# Patient Record
Sex: Female | Born: 1985 | Race: White | Hispanic: No | Smoking: Current every day smoker
Health system: Southern US, Community
[De-identification: ages and names within clinical notes are randomized; demographics above are authoritative.]

## PROBLEM LIST (undated history)

## (undated) DIAGNOSIS — F319 Bipolar disorder, unspecified: Secondary | ICD-10-CM

## (undated) HISTORY — PX: CHOLECYSTECTOMY: SHX55

## (undated) HISTORY — PX: TUBAL LIGATION: SHX77

## (undated) HISTORY — PX: TONSILLECTOMY: SUR1361

---

## 2019-09-15 ENCOUNTER — Encounter (HOSPITAL_COMMUNITY): Payer: Self-pay | Admitting: Emergency Medicine

## 2019-09-15 ENCOUNTER — Other Ambulatory Visit: Payer: Self-pay

## 2019-09-15 ENCOUNTER — Emergency Department (HOSPITAL_COMMUNITY)
Admission: EM | Admit: 2019-09-15 | Discharge: 2019-09-15 | Disposition: A | Discharge status: Home / Self Care | Payer: Self-pay | Attending: Emergency Medicine | Admitting: Emergency Medicine

## 2019-09-15 DIAGNOSIS — Y999 Unspecified external cause status: Secondary | ICD-10-CM | POA: Insufficient documentation

## 2019-09-15 DIAGNOSIS — X58XXXA Exposure to other specified factors, initial encounter: Secondary | ICD-10-CM | POA: Insufficient documentation

## 2019-09-15 DIAGNOSIS — K0889 Other specified disorders of teeth and supporting structures: Secondary | ICD-10-CM | POA: Insufficient documentation

## 2019-09-15 DIAGNOSIS — S025XXA Fracture of tooth (traumatic), initial encounter for closed fracture: Secondary | ICD-10-CM | POA: Insufficient documentation

## 2019-09-15 DIAGNOSIS — Y929 Unspecified place or not applicable: Secondary | ICD-10-CM | POA: Insufficient documentation

## 2019-09-15 DIAGNOSIS — Y9389 Activity, other specified: Secondary | ICD-10-CM | POA: Insufficient documentation

## 2019-09-15 DIAGNOSIS — K029 Dental caries, unspecified: Secondary | ICD-10-CM | POA: Insufficient documentation

## 2019-09-15 MED ORDER — AMOXICILLIN-POT CLAVULANATE 875-125 MG PO TABS: 1.0000 | ORAL_TABLET | Freq: Two times a day (BID) | ORAL | 0 refills | Status: DC

## 2019-09-15 MED ORDER — CHLORHEXIDINE GLUCONATE 0.12 % MT SOLN: 15.0000 mL | Freq: Two times a day (BID) | OROMUCOSAL | 0 refills | Status: DC

## 2019-09-15 NOTE — Progress Notes (Signed)
TOC CM received referral to assist with scheduling appt for follow up with dentist. TOC CM contacted pt and states she will schedule appt. Pt able to afford medication. Referring ED provider updated. Isidoro Donning RN CCM, [facility] ED TOC CM [phone]

## 2019-09-15 NOTE — ED Triage Notes (Signed)
Pt c/o right upper dental abscess for a couple of days.

## 2019-09-15 NOTE — Discharge Instructions (Addendum)
Please take Augmentin as prescribed.  Please follow-up with the dentist and I have provided you a phone number for.  Please take Benadryl if you have any itching or rash associated with your antibiotics.  Did not need drainage  Please also use the mouth rinse and I have provided twice daily.

## 2019-09-15 NOTE — ED Provider Notes (Signed)
[facility] DEPT Provider Note   CSN: [id number] Arrival date & time: 09/15/19  1119     History Chief Complaint  Patient presents with  . Dental Pain    Leah Shelton is a 34 y.o. female.   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  States that she has been clean for several years however she has had poor dentition since that time.  She has no dental insurance and states that she has not been able to follow-up with her dentist.  She is complaining of right upper dental pain that has been ongoing for 2 days.  She states that there has been no change in appearance of her teeth or gums however she states that it has been achy, constant, worse with touch with no associated fevers or chills or headaches.  She states she feels otherwise well.  She states that she has been able to swallow and she without difficulty.  She is concerned for dental infection like to be checked for that today.  She denies any nausea, vomiting, abdominal pain, facial swelling, neck pain, lockjaw, drooling, oral bleeding recent biotic use, history of cancer.    History reviewed. No pertinent past medical history.  There are no problems to display for this patient.   History reviewed. No pertinent surgical history.   OB History   No obstetric history on file.     No family history on file.  Social History   Tobacco Use  . Smoking status: Not on file  Substance Use Topics  . Alcohol use: Not on file  . Drug use: Not on file    Home  Medications Prior to Admission medications   Medication Sig Start Date End Date Taking? Authorizing Provider  amoxicillin-clavulanate (AUGMENTIN) 875-125 MG tablet Take 1 tablet by mouth every 12 (twelve) hours. 09/15/19   Tedd Sias, PA  chlorhexidine (PERIDEX) 0.12 % solution Use as directed 15 mLs in the mouth or throat 2 (two) times daily. 09/15/19   Tedd Sias, PA    Allergies    Keflex [cephalexin]  Review of Systems   Review of Systems  Constitutional: Negative for chills, diaphoresis, fatigue and fever.  HENT: Positive for dental problem. Negative for congestion, drooling and facial swelling.   Respiratory: Negative for shortness of breath.   Cardiovascular: Negative for chest pain.  Gastrointestinal: Negative for abdominal distention.  Neurological: Negative for dizziness and headaches.    Physical Exam Updated Vital Signs BP 123/81 (BP Location: Right Arm)   Pulse 87   Temp 97.7 F (36.5 C) (Oral)   Resp 16   LMP 09/11/2019   SpO2 100%   Physical Exam Vitals and nursing note reviewed.  Constitutional:      General: She is not in acute distress.    Appearance: Normal appearance. She is not ill-appearing.     Comments: Well-appearing 34 year old female in no significant distress, pleasant, appears stated age, able answer questions verbally and follow commands  HENT:     Head: Normocephalic and atraumatic.     Mouth/Throat:  Mouth: Mucous membranes are moist.     Dentition: Abnormal dentition. Dental caries present. No dental abscesses.      Comments: Poor dentition throughout.  No areas of swelling or fluctuance.  Tongue is fully mobile with no sublingual tenderness to palpation.  Posterior pharynx is patent with no tonsillar hypertrophy or exudate.  Multiple broken teeth throughout as well as numerous caries Eyes:     General: No scleral icterus.       Right eye: No discharge.        Left eye: No discharge.     Conjunctiva/sclera: Conjunctivae  normal.  Cardiovascular:     Rate and Rhythm: Normal rate.  Pulmonary:     Effort: Pulmonary effort is normal.     Breath sounds: No stridor.  Musculoskeletal:     Cervical back: Normal range of motion.  Lymphadenopathy:     Cervical: No cervical adenopathy.  Neurological:     Mental Status: She is alert and oriented to person, place, and time. Mental status is at baseline.  Psychiatric:        Mood and Affect: Mood normal.        Behavior: Behavior normal.     ED Results / Procedures / Treatments   Labs (all labs ordered are listed, but only abnormal results are displayed) Labs Reviewed - No data to display  EKG None  Radiology No results found.  Procedures Procedures (including critical care time)  Medications Ordered in ED Medications - No data to display  ED Course  I have reviewed the triage vital signs and the nursing notes.  Pertinent labs & imaging results that were available during my care of the patient were reviewed by me and considered in my medical decision making (see chart for details).    MDM Rules/Calculators/A&P                      Patient is well-appearing 34 year old female with no significant past medical history other than opioid abuse.  She has poor pain dentition for many years.  Has had dental pain for the past 2 days located in the patient's right upper molars.  No other significant symptoms today.  Physical exam is relatively unremarkable apart from poor dentition.  No evidence of abscess.  Doubt Ludewig's, trench mouth/mucormycosis, or deep space infection.  No lock jaw, trismus or meningismus.  Patient is well-appearing this time will discharge with antibiotics and recommend follow-up with dentist.  Patient given information on our on-call dentistry.     Final Clinical Impression(s) / ED Diagnoses Final diagnoses:  Pain, dental  Dental caries  Closed fracture of tooth, initial encounter    Rx / DC Orders ED Discharge Orders           Ordered    amoxicillin-clavulanate (AUGMENTIN) 875-125 MG tablet  Every 12 hours     09/15/19 1207    chlorhexidine (PERIDEX) 0.12 % solution  2 times daily     09/15/19 1207           Solon Augusta [place], [place] 09/15/19 1208    Arby Barrette, MD 09/19/19 1328

## 2020-01-06 ENCOUNTER — Ambulatory Visit (HOSPITAL_COMMUNITY)
Admission: EM | Admit: 2020-01-06 | Discharge: 2020-01-06 | Disposition: A | Discharge status: Home / Self Care | Payer: Self-pay | Attending: Family Medicine | Admitting: Family Medicine

## 2020-01-06 ENCOUNTER — Encounter (HOSPITAL_COMMUNITY): Payer: Self-pay

## 2020-01-06 ENCOUNTER — Other Ambulatory Visit: Payer: Self-pay

## 2020-01-06 DIAGNOSIS — N898 Other specified noninflammatory disorders of vagina: Secondary | ICD-10-CM | POA: Insufficient documentation

## 2020-01-06 DIAGNOSIS — R509 Fever, unspecified: Secondary | ICD-10-CM

## 2020-01-06 DIAGNOSIS — Z3202 Encounter for pregnancy test, result negative: Secondary | ICD-10-CM

## 2020-01-06 DIAGNOSIS — R1030 Lower abdominal pain, unspecified: Secondary | ICD-10-CM | POA: Insufficient documentation

## 2020-01-06 HISTORY — DX: Bipolar disorder, unspecified: F31.9

## 2020-01-06 LAB — POCT URINALYSIS DIP (DEVICE)
Bilirubin Urine: NEGATIVE
Glucose, UA: NEGATIVE mg/dL
Hgb urine dipstick: NEGATIVE
Ketones, ur: NEGATIVE mg/dL
Nitrite: NEGATIVE
Protein, ur: NEGATIVE mg/dL
Specific Gravity, Urine: <=1.005 (ref 1.005–1.030)
Urobilinogen, UA: 0.2 mg/dL (ref 0.0–1.0)
pH: 6.5 (ref 5.0–8.0)

## 2020-01-06 LAB — POC URINE PREG, ED: Preg Test, Ur: NEGATIVE

## 2020-01-06 MED ORDER — GENTAMICIN SULFATE 40 MG/ML IJ SOLN
INTRAMUSCULAR | Status: AC
  Filled 2020-01-06: qty 6

## 2020-01-06 MED ORDER — AZITHROMYCIN 250 MG PO TABS
ORAL_TABLET | ORAL | Status: AC
  Filled 2020-01-06: qty 4

## 2020-01-06 MED ORDER — AZITHROMYCIN 250 MG PO TABS
2000.0000 mg | ORAL_TABLET | Freq: Once | ORAL | Status: AC
  Administered 2020-01-06: 2000 mg via ORAL

## 2020-01-06 MED ORDER — GENTAMICIN SULFATE 40 MG/ML IJ SOLN
240.0000 mg | Freq: Once | INTRAMUSCULAR | Status: AC
  Administered 2020-01-06: 240 mg via INTRAMUSCULAR

## 2020-01-06 NOTE — ED Triage Notes (Signed)
Pt c/o nausea, HA, fever, 6/10 lower abdominal pain since yesterday. Pt denies V/D. Pt last BM was today. Pt states she got COVID rapid tested today and it was neg.

## 2020-01-06 NOTE — Discharge Instructions (Signed)
You have been given the following today for treatment of suspected gonorrhea and/or chlamydia:  Meds ordered this encounter  Medications   gentamicin (GARAMYCIN) injection 240 mg   azithromycin (ZITHROMAX) tablet 2,000 mg    Even though we have treated you today, we have sent testing for sexually transmitted infections. We will notify you of any positive results once they are received. If required, we will prescribe any medications you might need.  Please refrain from all sexual activity for at least the next seven days.

## 2020-01-06 NOTE — ED Provider Notes (Signed)
[facility]   [id number] 01/06/20 Arrival Time: 1343  ASSESSMENT & PLAN:  1. Vaginal discharge   2. Lower abdominal pain     Overall benign abdominal exam. No indications for urgent abdominal/pelvic imaging at this time. Discussed possibility of early PID. Urine with leukocytes. Urine culture and vaginal cytology pending. She agrees to empiric treatment. Cephalosporin allergy.    Discharge Instructions      You have been given the following today for treatment of suspected gonorrhea and/or chlamydia:  Meds ordered this encounter  Medications  . gentamicin (GARAMYCIN) injection 240 mg  . azithromycin (ZITHROMAX) tablet 2,000 mg    Even though we have treated you today, we have sent testing for sexually transmitted infections. We will notify you of any positive results once they are received. If required, we will prescribe any medications you might need.  Please refrain from all sexual activity for at least the next seven days.      ED with worsening symptoms. Reviewed expectations re: course of current medical issues. Questions answered. Outlined signs and symptoms indicating need for more acute intervention. Patient verbalized understanding. After Visit Summary given.   SUBJECTIVE: History from: patient. Leah Shelton is a 34 y.o. female who presents with complaint of intermittent lower abdominal discomfort. Onset gradual, first noted yesterday; 6/10 when present; poorly localized. Does not specifically wake her at night. Symptoms are unchanged since beginning. Fever: believed to be present, temp not taken. Aggravating factors: have not been identified. Alleviating factors: have not been identified. Associated symptoms: nausea without vomiting. She denies arthralgias and myalgias. Appetite: normal. PO intake: decreased. Ambulatory without assistance. Urinary symptoms: none. Bowel movements: have not significantly changed. History of similar: no.  OTC  treatment: none.  Questions exposure to STD a month or two ago. No care sought. Does reports some abnormal vaginal discharge recently.  Rapid COVID test today negative at outside facility.  No LMP recorded. Past Surgical History:  Procedure Laterality Date  . CHOLECYSTECTOMY    . TONSILLECTOMY    . TUBAL LIGATION       OBJECTIVE:  Vitals:   01/06/20 1408  BP: 106/66  Pulse: 88  Resp: 16  Temp: 98.7 F (37.1 C)  TempSrc: Oral  SpO2: 98%  Weight: 65.8 kg  Height: 5\' 2"  (1.575 m)    General appearance: alert, oriented, no acute distress HEENT: ; AT Lungs: unlabored respirations Abdomen: soft; without distention; no specific tenderness to palpation; without masses or organomegaly; without guarding or rebound tenderness GU: deferred Back: without reported CVA tenderness; FROM at waist Extremities: without LE edema; symmetrical; without gross deformities Skin: warm and dry Neurologic: normal gait Psychological: alert and cooperative; normal mood and affect  Labs: Results for orders placed or performed during the hospital encounter of 01/06/20  POC urine pregnancy  Result Value Ref Range   Preg Test, Ur NEGATIVE NEGATIVE  POCT urinalysis dip (device)  Result Value Ref Range   Glucose, UA NEGATIVE NEGATIVE mg/dL   Bilirubin Urine NEGATIVE NEGATIVE   Ketones, ur NEGATIVE NEGATIVE mg/dL   Specific Gravity, Urine <=1.005 1.005 - 1.030   Hgb urine dipstick NEGATIVE NEGATIVE   pH 6.5 5.0 - 8.0   Protein, ur NEGATIVE NEGATIVE mg/dL   Urobilinogen, UA 0.2 0.0 - 1.0 mg/dL   Nitrite NEGATIVE NEGATIVE   Leukocytes,Ua TRACE (A) NEGATIVE      Allergies  Allergen Reactions  . Keflex [Cephalexin] Hives  Past Medical History:  Diagnosis Date  . Bipolar 1 disorder ([place])     Social History   Socioeconomic History  . Marital status: Divorced    Spouse name: Not on file  . Number of children: Not on file  . Years of  education: Not on file  . Highest education level: Not on file  Occupational History  . Not on file  Tobacco Use  . Smoking status: Current Every Day Smoker    Packs/day: 0.50    Types: Cigarettes  . Smokeless tobacco: Never Used  Substance and Sexual Activity  . Alcohol use: Never  . Drug use: Never  . Sexual activity: Not on file  Other Topics Concern  . Not on file  Social History Narrative  . Not on file   Social Determinants of Health   Financial Resource Strain:   . Difficulty of Paying Living Expenses:   Food Insecurity:   . Worried About Charity fundraiser in the Last Year:   . Arboriculturist in the Last Year:   Transportation Needs:   . Film/video editor (Medical):   Marland Kitchen Lack of Transportation (Non-Medical):   Physical Activity:   . Days of Exercise per Week:   . Minutes of Exercise per Session:   Stress:   . Feeling of Stress :   Social Connections:   . Frequency of Communication with Friends and Family:   . Frequency of Social Gatherings with Friends and Family:   . Attends Religious Services:   . Active Member of Clubs or Organizations:   . Attends Archivist Meetings:   Marland Kitchen Marital Status:   Intimate Partner Violence:   . Fear of Current or Ex-Partner:   . Emotionally Abused:   Marland Kitchen Physically Abused:   . Sexually Abused:     Family History  Problem Relation Age of Onset  . Healthy Mother   . Healthy Father      Vanessa Kick, MD 01/06/20 [phone]

## 2020-01-06 NOTE — ED Notes (Signed)
Cervicovaginal swab placed in lab, labeling and verified

## 2020-01-07 ENCOUNTER — Telehealth (HOSPITAL_COMMUNITY): Payer: Self-pay | Admitting: Orthopedic Surgery

## 2020-01-07 LAB — CERVICOVAGINAL ANCILLARY ONLY
Bacterial Vaginitis (gardnerella): POSITIVE — AB
Candida Glabrata: NEGATIVE
Candida Vaginitis: NEGATIVE
Chlamydia: POSITIVE — AB
Comment: NEGATIVE
Comment: NEGATIVE
Comment: NEGATIVE
Comment: NEGATIVE
Comment: NEGATIVE
Comment: NORMAL
Neisseria Gonorrhea: NEGATIVE
Trichomonas: NEGATIVE

## 2020-01-07 MED ORDER — METRONIDAZOLE 500 MG PO TABS: 500.0000 mg | ORAL_TABLET | Freq: Two times a day (BID) | ORAL | 0 refills | Status: DC

## 2020-01-08 ENCOUNTER — Telehealth (HOSPITAL_COMMUNITY): Payer: Self-pay | Admitting: Orthopedic Surgery

## 2020-01-08 LAB — URINE CULTURE: Culture: 10000 — AB

## 2020-01-08 MED ORDER — NITROFURANTOIN MONOHYD MACRO 100 MG PO CAPS: 100.0000 mg | ORAL_CAPSULE | Freq: Two times a day (BID) | ORAL | 0 refills | Status: DC

## 2020-02-24 ENCOUNTER — Other Ambulatory Visit: Payer: Self-pay

## 2020-02-24 ENCOUNTER — Telehealth (INDEPENDENT_AMBULATORY_CARE_PROVIDER_SITE_OTHER): Payer: No Payment, Other | Admitting: Psychiatric/Mental Health

## 2020-02-24 DIAGNOSIS — F339 Major depressive disorder, recurrent, unspecified: Secondary | ICD-10-CM | POA: Diagnosis not present

## 2020-02-24 DIAGNOSIS — F411 Generalized anxiety disorder: Secondary | ICD-10-CM

## 2020-02-24 DIAGNOSIS — F3162 Bipolar disorder, current episode mixed, moderate: Secondary | ICD-10-CM | POA: Diagnosis not present

## 2020-02-24 DIAGNOSIS — F329 Major depressive disorder, single episode, unspecified: Secondary | ICD-10-CM | POA: Insufficient documentation

## 2020-02-24 MED ORDER — GABAPENTIN 300 MG PO CAPS: 300.0000 mg | ORAL_CAPSULE | Freq: Three times a day (TID) | ORAL | 2 refills | Status: DC

## 2020-02-24 MED ORDER — LITHIUM CARBONATE ER 300 MG PO TBCR: 300.0000 mg | EXTENDED_RELEASE_TABLET | Freq: Two times a day (BID) | ORAL | 3 refills | Status: DC

## 2020-03-15 ENCOUNTER — Encounter (HOSPITAL_COMMUNITY): Payer: Self-pay | Admitting: Psychiatric/Mental Health
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
## 2020-04-05 ENCOUNTER — Ambulatory Visit (INDEPENDENT_AMBULATORY_CARE_PROVIDER_SITE_OTHER): Payer: No Payment, Other | Admitting: Clinical

## 2020-04-05 ENCOUNTER — Other Ambulatory Visit: Payer: Self-pay

## 2020-04-05 DIAGNOSIS — F1111 Opioid abuse, in remission: Secondary | ICD-10-CM

## 2020-04-05 DIAGNOSIS — F3162 Bipolar disorder, current episode mixed, moderate: Secondary | ICD-10-CM
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
## 2020-05-26 ENCOUNTER — Other Ambulatory Visit: Payer: Self-pay

## 2020-05-26 ENCOUNTER — Telehealth (HOSPITAL_COMMUNITY): Payer: No Payment, Other

## 2020-05-26 ENCOUNTER — Telehealth (HOSPITAL_COMMUNITY): Payer: No Payment, Other | Admitting: Physician Assistant

## 2020-06-02 ENCOUNTER — Other Ambulatory Visit: Payer: Self-pay

## 2020-06-02 ENCOUNTER — Ambulatory Visit (HOSPITAL_COMMUNITY): Payer: No Payment, Other | Admitting: Clinical

## 2020-06-16 ENCOUNTER — Telehealth (HOSPITAL_COMMUNITY): Payer: Self-pay | Admitting: Clinical

## 2020-06-16 ENCOUNTER — Other Ambulatory Visit: Payer: Self-pay

## 2020-06-16 ENCOUNTER — Ambulatory Visit (HOSPITAL_COMMUNITY): Payer: No Payment, Other | Admitting: Clinical

## 2020-06-16 NOTE — Telephone Encounter (Signed)
Therapist sent the client a text message link for the scheduled therapy MyChart video visit. Client did not check in for the appointment using the link. Therapist followed up with an attempted phone call to the clients cell phone indicated as preferred method of contact, to attempt second method of contact with the client. Client did not answer. Therapist left a voice mail for the client to call back to reschedule the appointment.

## 2020-06-29 ENCOUNTER — Telehealth (HOSPITAL_COMMUNITY): Payer: Self-pay | Admitting: *Deleted

## 2020-06-29 NOTE — Telephone Encounter (Signed)
VM from patient stating the next appt we have here is early Feb, yet on the chart there is not an appt scheduled. She missed her last scheduled appt in mid Nov. She is out of her medicine, has not been seen since 8/3 but did have enough med and refills to get till Nov. Front desk staff called her to reschedule. She was vague, and after much back and forth only day she could be seen this week is fri and we have nothing available on fri. Offered her a appt today @ 3 but couldn't come, states she is working and refused a virtual today too. When gave her the first fri appt it was [place] and she said she couldn't wait that long and now says her schedule changes weekly and not sure if appt in jan would be her fri off. Agreed she would call back when she has her next weeks schedule.

## 2020-11-01 ENCOUNTER — Other Ambulatory Visit: Payer: Self-pay

## 2020-11-01 ENCOUNTER — Encounter (HOSPITAL_COMMUNITY): Payer: Self-pay

## 2020-11-01 ENCOUNTER — Emergency Department (HOSPITAL_COMMUNITY)
Admission: EM | Admit: 2020-11-01 | Discharge: 2020-11-01 | Disposition: A | Discharge status: Home / Self Care | Payer: Self-pay | Attending: Emergency Medicine | Admitting: Emergency Medicine

## 2020-11-01 DIAGNOSIS — K047 Periapical abscess without sinus: Secondary | ICD-10-CM | POA: Insufficient documentation

## 2020-11-01 DIAGNOSIS — F17210 Nicotine dependence, cigarettes, uncomplicated: Secondary | ICD-10-CM | POA: Insufficient documentation

## 2020-11-01 DIAGNOSIS — K029 Dental caries, unspecified: Secondary | ICD-10-CM | POA: Insufficient documentation

## 2020-11-01 MED ORDER — AMOXICILLIN-POT CLAVULANATE 875-125 MG PO TABS: 1.0000 | ORAL_TABLET | Freq: Two times a day (BID) | ORAL | 0 refills | Status: AC

## 2020-11-01 NOTE — ED Triage Notes (Signed)
Pt reports dental pain to right of mouth and states the pain radiates up her face.

## 2020-11-01 NOTE — ED Provider Notes (Addendum)
[facility]-EMERGENCY DEPT Provider Note   CSN: [id number] Arrival date & time: 11/01/20  1822     History Chief Complaint  Patient presents with  . Dental Pain    Leah Shelton is a 35 y.o. female with PMH of bipolar 1, MDD, GAD that presents to the ER for dental pain. Pt states that she has had multiple dental caries for the last couple of years from opiate use. Not seen a dentist. Complaining of toothache in the R upper part of her mouth, started a couple days ago. Has tried tylenol without relief. Has tried calling a dentist, apt far out. Toothpain worse today. Doesn't radiate. Denies any fevers, trismus, drooling, difficulty swallowing or breathing. No sore throat, ear pain, HA. Has not seen a dentist. No other complaints.    HPI     Past Medical History:  Diagnosis Date  . Bipolar 1 disorder [facility])     Patient Active Problem List   Diagnosis Date Noted  . Bipolar 1 disorder, mixed, moderate (HCC) 02/24/2020  . MDD (major depressive disorder) 02/24/2020  . GAD (generalized anxiety disorder) 02/24/2020    Past Surgical History:  Procedure Laterality Date  . CHOLECYSTECTOMY    . TONSILLECTOMY    . TUBAL LIGATION       OB History   No obstetric history on file.     Family History  Problem Relation Age of Onset  . Healthy Mother   . Healthy Father     Social History   Tobacco Use  . Smoking status: Current Every Day Smoker    Packs/day: 0.50    Types: Cigarettes  . Smokeless tobacco: Never Used  Substance Use Topics  . Alcohol use: Never  . Drug use: Never    Home Medications Prior to Admission medications   Medication Sig Start Date End Date Taking? Authorizing Provider  amoxicillin-clavulanate (AUGMENTIN) 875-125 MG tablet Take 1 tablet by mouth every 12 (twelve) hours for 7 days. 11/01/20 11/08/20 Yes Emauri Krygier, PA-C  gabapentin (NEURONTIN) 300 MG capsule Take 1 capsule (300 mg total) by mouth 3 (three) times daily.  02/24/20 02/23/21  Jearld Lesch, NP  lithium carbonate (LITHOBID) 300 MG CR tablet Take 1 tablet (300 mg total) by mouth in the morning and at bedtime. 02/24/20 06/23/20  Jearld Lesch, NP    Allergies    Keflex [cephalexin]  Review of Systems   Review of Systems  Constitutional: Negative for diaphoresis, fatigue and fever.  HENT: Positive for dental problem. Negative for drooling, ear discharge, ear pain, facial swelling, hearing loss, sinus pressure, sinus pain, sore throat, tinnitus and trouble swallowing.   Eyes: Negative for visual disturbance.  Respiratory: Negative for shortness of breath.   Cardiovascular: Negative for chest pain.  Gastrointestinal: Negative for nausea and vomiting.  Musculoskeletal: Negative for back pain and myalgias.  Skin: Negative for color change, pallor, rash and wound.  Neurological: Negative for syncope, weakness, light-headedness, numbness and headaches.  Psychiatric/Behavioral: Negative for behavioral problems and confusion.    Physical Exam Updated Vital Signs BP (!) 141/86 (BP Location: Right Arm)   Pulse 72   Temp 98.6 F (37 C) (Oral)   Resp 18   SpO2 99%   Physical Exam Constitutional:      General: She is not in acute distress.    Appearance: Normal appearance. She is not ill-appearing, toxic-appearing or diaphoretic.  HENT:     Head: Normocephalic and atraumatic.      Comments: Facial  pain in area depicted. No swelling or erythema. No trismus.     Mouth/Throat:     Mouth: Mucous membranes are moist.     Dentition: Abnormal dentition. Dental tenderness and dental caries present. No dental abscesses.     Tongue: No lesions.     Palate: No mass.     Pharynx: Oropharynx is clear. Uvula midline. No pharyngeal swelling.     Tonsils: No tonsillar exudate or tonsillar abscesses. 0 on the right. 0 on the left.      Comments: Multiple dental caries, has about 4 teeth in upper row and 6 teeth in lower row, all broken off. R upper tooth,  tooth 5/6 with tenderness on gum line. Erythema noted, no palpable abscess. No swelling under tongue.  Eyes:     Extraocular Movements: Extraocular movements intact.     Pupils: Pupils are equal, round, and reactive to light.  Cardiovascular:     Rate and Rhythm: Normal rate and regular rhythm.     Pulses: Normal pulses.  Pulmonary:     Effort: Pulmonary effort is normal.     Breath sounds: Normal breath sounds.  Musculoskeletal:        General: Normal range of motion.  Skin:    General: Skin is warm and dry.     Capillary Refill: Capillary refill takes less than 2 seconds.  Neurological:     General: No focal deficit present.     Mental Status: She is alert and oriented to person, place, and time.  Psychiatric:        Mood and Affect: Mood normal.        Behavior: Behavior normal.        Thought Content: Thought content normal.     ED Results / Procedures / Treatments   Labs (all labs ordered are listed, but only abnormal results are displayed) Labs Reviewed - No data to display  EKG None  Radiology No results found.  Procedures Procedures   Medications Ordered in ED Medications - No data to display  ED Course  I have reviewed the triage vital signs and the nursing notes.  Pertinent labs & imaging results that were available during my care of the patient were reviewed by me and considered in my medical decision making (see chart for details).    MDM Rules/Calculators/A&P                          Leah Shelton is a 35 y.o. female with PMH of bipolar 1, MDD, GAD that presents to the ER for dental pain. Pt with  No red flag symptoms. No fever, signs of Ludwigs or deep space infection.  Dental pain associated with dental infection and possible dental abscess with patient afebrile, non toxic appearing and swallowing secretions well. I gave patient referral to dentist and stressed the importance of dental follow up for ultimate management of dental pain. I have  also discussed reasons to return immediately to the ER.  Patient expresses understanding and agrees with plan.  Pt does express remote allergy to Keflex with reaction of hives, no airway involvement. Wants to proceed with Augmentin, states she has taken Amoxil before. Did offer clinda, but she wants to do Augmentin. Strict return precaution give in regards to allergy.  Doubt need for further emergent work up at this time. I explained the diagnosis and have given explicit precautions to return to the ER including for any other new or  worsening symptoms. The patient understands and accepts the medical plan as it's been dictated and I have answered their questions. Discharge instructions concerning home care and prescriptions have been given. The patient is STABLE and is discharged to home in good condition.  Final Clinical Impression(s) / ED Diagnoses Final diagnoses:  Dental infection    Rx / DC Orders ED Discharge Orders         Ordered    amoxicillin-clavulanate (AUGMENTIN) 875-125 MG tablet  Every 12 hours        11/01/20 1848             Farrel Gordon, PA-C 11/01/20 Herbie Baltimore    Tilden Fossa, MD 11/02/20 1453

## 2020-11-01 NOTE — Discharge Instructions (Signed)
Please see a dentist ASAP, resources attached for dentist in the area.  Take antibiotic as prescribed, if you start having allergic reaction call 911 and take benadryl.  Take Tylenol as directed on the bottle for pain.  Please speak to a pharmacist about any new medications prescribed today in regards to side effects. Come back to the ER for any new or worsening symptoms.   Get help right away if: You have a fever or chills. Your symptoms suddenly get worse. You have a very bad headache. You have problems breathing or swallowing. You have trouble opening your mouth. You have swelling in your neck or around your eye.

## 2020-11-17 ENCOUNTER — Other Ambulatory Visit: Payer: Self-pay

## 2020-11-17 ENCOUNTER — Telehealth (INDEPENDENT_AMBULATORY_CARE_PROVIDER_SITE_OTHER): Payer: No Payment, Other | Admitting: Psychiatry

## 2020-11-17 ENCOUNTER — Encounter (HOSPITAL_COMMUNITY): Payer: Self-pay | Admitting: Psychiatry

## 2020-11-17 DIAGNOSIS — F411 Generalized anxiety disorder: Secondary | ICD-10-CM

## 2020-11-17 DIAGNOSIS — F4310 Post-traumatic stress disorder, unspecified: Secondary | ICD-10-CM | POA: Diagnosis not present

## 2020-11-17 DIAGNOSIS — F3181 Bipolar II disorder: Secondary | ICD-10-CM

## 2020-11-17 MED ORDER — TRAZODONE HCL 50 MG PO TABS
50.0000 mg | ORAL_TABLET | Freq: Every day | ORAL | 2 refills | Status: DC
  Filled 2020-11-17: qty 30, 30d supply, fill #0

## 2020-11-17 MED ORDER — GABAPENTIN 300 MG PO CAPS
300.0000 mg | ORAL_CAPSULE | Freq: Three times a day (TID) | ORAL | 2 refills | Status: DC
  Filled 2020-11-17: qty 90, 30d supply, fill #0

## 2020-11-17 MED ORDER — CAPLYTA 42 MG PO CAPS
42.0000 mg | ORAL_CAPSULE | Freq: Every day | ORAL | 2 refills | Status: DC
  Filled 2020-11-17: qty 30, fill #0
  Filled 2020-11-17: qty 30, 30d supply, fill #0
  Filled 2020-12-22: qty 30, 30d supply, fill #1
  Filled 2021-01-25: qty 30, 30d supply, fill #2
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
## 2020-11-18 ENCOUNTER — Other Ambulatory Visit: Payer: Self-pay

## 2020-11-19 ENCOUNTER — Other Ambulatory Visit: Payer: Self-pay

## 2020-11-22 ENCOUNTER — Other Ambulatory Visit: Payer: Self-pay

## 2020-12-03 ENCOUNTER — Other Ambulatory Visit: Payer: Self-pay

## 2020-12-15 ENCOUNTER — Emergency Department (HOSPITAL_COMMUNITY): Payer: Self-pay

## 2020-12-15 ENCOUNTER — Other Ambulatory Visit: Payer: Self-pay

## 2020-12-15 ENCOUNTER — Emergency Department (HOSPITAL_COMMUNITY)
Admission: EM | Admit: 2020-12-15 | Discharge: 2020-12-15 | Disposition: A | Discharge status: Home / Self Care | Payer: Self-pay | Attending: Emergency Medicine | Admitting: Emergency Medicine

## 2020-12-15 ENCOUNTER — Encounter (HOSPITAL_COMMUNITY): Payer: Self-pay

## 2020-12-15 DIAGNOSIS — R112 Nausea with vomiting, unspecified: Secondary | ICD-10-CM | POA: Insufficient documentation

## 2020-12-15 DIAGNOSIS — R0602 Shortness of breath: Secondary | ICD-10-CM | POA: Insufficient documentation

## 2020-12-15 DIAGNOSIS — Z20822 Contact with and (suspected) exposure to covid-19: Secondary | ICD-10-CM | POA: Insufficient documentation

## 2020-12-15 DIAGNOSIS — R197 Diarrhea, unspecified: Secondary | ICD-10-CM | POA: Insufficient documentation

## 2020-12-15 DIAGNOSIS — R062 Wheezing: Secondary | ICD-10-CM | POA: Insufficient documentation

## 2020-12-15 DIAGNOSIS — F17210 Nicotine dependence, cigarettes, uncomplicated: Secondary | ICD-10-CM | POA: Insufficient documentation

## 2020-12-15 DIAGNOSIS — R059 Cough, unspecified: Secondary | ICD-10-CM | POA: Insufficient documentation

## 2020-12-15 DIAGNOSIS — R509 Fever, unspecified: Secondary | ICD-10-CM | POA: Insufficient documentation

## 2020-12-15 LAB — RESP PANEL BY RT-PCR (FLU A&B, COVID) ARPGX2
Influenza A by PCR: POSITIVE — AB
Influenza B by PCR: NEGATIVE
SARS Coronavirus 2 by RT PCR: NEGATIVE

## 2020-12-15 MED ORDER — ALBUTEROL SULFATE (2.5 MG/3ML) 0.083% IN NEBU
5.0000 mg | INHALATION_SOLUTION | Freq: Once | RESPIRATORY_TRACT | Status: AC
  Administered 2020-12-15: 5 mg via RESPIRATORY_TRACT
  Filled 2020-12-15: qty 6

## 2020-12-15 MED ORDER — ALBUTEROL SULFATE HFA 108 (90 BASE) MCG/ACT IN AERS
2.0000 | INHALATION_SPRAY | Freq: Once | RESPIRATORY_TRACT | Status: AC
  Administered 2020-12-15: 2 via RESPIRATORY_TRACT
  Filled 2020-12-15: qty 6.7

## 2020-12-15 MED ORDER — BENZONATATE 100 MG PO CAPS: 100.0000 mg | ORAL_CAPSULE | Freq: Three times a day (TID) | ORAL | 0 refills | Status: AC

## 2020-12-15 MED ORDER — IPRATROPIUM BROMIDE 0.02 % IN SOLN
0.5000 mg | Freq: Once | RESPIRATORY_TRACT | Status: AC
  Administered 2020-12-15: 0.5 mg via RESPIRATORY_TRACT
  Filled 2020-12-15: qty 2.5

## 2020-12-15 MED ORDER — ALBUTEROL SULFATE HFA 108 (90 BASE) MCG/ACT IN AERS: 2.0000 | INHALATION_SPRAY | RESPIRATORY_TRACT | 0 refills | Status: AC | PRN

## 2020-12-15 NOTE — ED Provider Notes (Signed)
[facility] Provider Note   CSN: [id number] Arrival date & time: 12/15/20  7616     History Chief Complaint  Patient presents with  . Cough    Leah Shelton is a 35 y.o. female.  The history is provided by the patient and medical records.  Cough Associated symptoms: fever and wheezing     35 y.o. F with hx bipolar disorder, presenting to the ED with various complaints.  Since Sunday she has been experiencing intermittent fevers, productive cough, nausea, vomiting, and diarrhea.  Her twin daughters are both sick with influenza a currently.  States tonight she seemed to be having a little more difficulty breathing and has noticed some wheezing.  She has no known history of asthma.  She is a smoker but states she has not smoked in the past 24 hours.  No sick exposures aside from children.  No known covid contacts.  Past Medical History:  Diagnosis Date  . Bipolar 1 disorder [facility])     Patient Active Problem List   Diagnosis Date Noted  . Bipolar 2 disorder, major depressive episode (HCC) 11/17/2020  . PTSD (post-traumatic stress disorder) 11/17/2020  . Bipolar 1 disorder, mixed, moderate (HCC) 02/24/2020  . MDD (major depressive disorder) 02/24/2020  . GAD (generalized anxiety disorder) 02/24/2020    Past Surgical History:  Procedure Laterality Date  . CHOLECYSTECTOMY    . TONSILLECTOMY    . TUBAL LIGATION       OB History   No obstetric history on file.     Family History  Problem Relation Age of Onset  . Healthy Mother   . Healthy Father     Social History   Tobacco Use  . Smoking status: Current Every Day Smoker    Packs/day: 0.50    Types: Cigarettes  . Smokeless tobacco: Never Used  Substance Use Topics  . Alcohol use: Never  . Drug use: Never    Home Medications Prior to Admission medications   Medication Sig Start Date End Date Taking? Authorizing Provider  gabapentin (NEURONTIN) 300 MG capsule Take 1  capsule (300 mg total) by mouth 3 (three) times daily. 11/17/20   Shanna Cisco, NP  Lumateperone Tosylate (CAPLYTA) 42 MG CAPS Take 42 mg by mouth daily. 11/17/20   Shanna Cisco, NP  traZODone (DESYREL) 50 MG tablet Take 1 tablet (50 mg total) by mouth at bedtime. 11/17/20   Shanna Cisco, NP    Allergies    Keflex [cephalexin]  Review of Systems   Review of Systems  Constitutional: Positive for fever.  Respiratory: Positive for cough and wheezing.   Gastrointestinal: Positive for diarrhea, nausea and vomiting.  All other systems reviewed and are negative.   Physical Exam Updated Vital Signs BP 130/82   Pulse 75   Temp 98.8 F (37.1 C) (Oral)   Resp 16   Ht 5\' 2"  (1.575 m)   Wt 68 kg   LMP 12/03/2020   SpO2 98%   BMI 27.44 kg/m   Physical Exam Vitals and nursing note reviewed.  Constitutional:      Appearance: She is well-developed.  HENT:     Head: Normocephalic and atraumatic.  Eyes:     Conjunctiva/sclera: Conjunctivae normal.     Pupils: Pupils are equal, round, and reactive to light.  Cardiovascular:     Rate and Rhythm: Normal rate and regular rhythm.     Heart sounds: Normal heart sounds.  Pulmonary:  Effort: Pulmonary effort is normal.     Breath sounds: No stridor. Wheezing present.     Comments: Normal work of breath, expiratory wheezes throughout, O2 sats 98% on RA Abdominal:     General: Bowel sounds are normal.     Palpations: Abdomen is soft.     Tenderness: There is no abdominal tenderness. There is no rebound.  Musculoskeletal:        General: Normal range of motion.     Cervical back: Normal range of motion.  Skin:    General: Skin is warm and dry.  Neurological:     Mental Status: She is alert and oriented to person, place, and time.     ED Results / Procedures / Treatments   Labs (all labs ordered are listed, but only abnormal results are displayed) Labs Reviewed  RESP PANEL BY RT-PCR (FLU A&B, COVID) [id number]     EKG None  Radiology No results found.  Procedures Procedures   Medications Ordered in ED Medications  albuterol (VENTOLIN HFA) 108 (90 Base) MCG/ACT inhaler 2 puff (has no administration in time range)  albuterol (PROVENTIL) (2.5 MG/3ML) 0.083% nebulizer solution 5 mg (5 mg Nebulization Given 12/15/20 0545)  ipratropium (ATROVENT) nebulizer solution 0.5 mg (0.5 mg Nebulization Given 12/15/20 0630)    ED Course  I have reviewed the triage vital signs and the nursing notes.  Pertinent labs & imaging results that were available during my care of the patient were reviewed by me and considered in my medical decision making (see chart for details).    MDM Rules/Calculators/A&P  35 year old female presenting to the ED after feeling unwell for the past 3 days.  Her twin daughters are currently both sick with influenza A.  She has had productive cough, nausea, vomiting, and diarrhea along with intermittent fevers.  She is afebrile and nontoxic in appearance here.  She does have diffuse wheezes on exam but is in no acute respiratory distress.  Her oxygen saturations are stable on room air.  She has no known history of asthma but is a smoker, has not actually smoked in the past 24 hours due to her symptoms.  Chest x-ray was obtained, questionable pneumonitis.  This is likely inflammatory from viral URI.  Suspicion that she may also be suffering from influenza A like her children.  She is out of the 48-hour window to initiate Tamiflu so we will aim for symptomatic care.  She was given neb treatment here with good improvement, states she does feel much better.  Maintaining good saturations on RA.  Feel she will be stable for discharge home.  Given albuterol inhaler, tessalon for cough.  She will be notified if viral panel is positive.  Close follow-up with PCP.  Return here for any new/acute changes.  Final Clinical Impression(s) / ED Diagnoses Final diagnoses:  Cough  Wheezing    Rx / DC  Orders ED Discharge Orders         Ordered    benzonatate (TESSALON) 100 MG capsule  Every 8 hours        12/15/20 0629    albuterol (VENTOLIN HFA) 108 (90 Base) MCG/ACT inhaler  Every 4 hours PRN        12/15/20 0629           Garlon Hatchet, PA-C 12/15/20 1962    Dione Booze, MD 12/15/20 [phone]

## 2020-12-15 NOTE — ED Triage Notes (Signed)
Pt c/o productive cough, fever, n/v/d since Sunday. Pt states her daughter tested positive for flu A a few days ago.

## 2020-12-15 NOTE — Discharge Instructions (Signed)
Take the prescribed medication as directed.  8 puffs of inhaler is the same as neb treatment. Follow-up with your primary care doctor. Return to the ED for new or worsening symptoms.

## 2020-12-22 ENCOUNTER — Telehealth: Payer: Self-pay

## 2020-12-22 ENCOUNTER — Other Ambulatory Visit: Payer: Self-pay

## 2021-01-25 ENCOUNTER — Other Ambulatory Visit: Payer: Self-pay

## 2021-02-16 ENCOUNTER — Telehealth (INDEPENDENT_AMBULATORY_CARE_PROVIDER_SITE_OTHER): Payer: No Payment, Other | Admitting: Psychiatry

## 2021-02-16 ENCOUNTER — Other Ambulatory Visit: Payer: Self-pay

## 2021-02-16 ENCOUNTER — Encounter (HOSPITAL_COMMUNITY): Payer: Self-pay | Admitting: Psychiatry

## 2021-02-16 DIAGNOSIS — F411 Generalized anxiety disorder: Secondary | ICD-10-CM

## 2021-02-16 DIAGNOSIS — F3181 Bipolar II disorder: Secondary | ICD-10-CM | POA: Diagnosis not present

## 2021-02-16 MED ORDER — GABAPENTIN 300 MG PO CAPS: 300.0000 mg | ORAL_CAPSULE | Freq: Three times a day (TID) | ORAL | 3 refills | Status: AC

## 2021-02-16 MED ORDER — LAMOTRIGINE 25 MG PO TABS
25.0000 mg | ORAL_TABLET | Freq: Every day | ORAL | 2 refills | Status: AC
Start: 2021-02-16 — End: ?
  Filled 2021-02-16: qty 30, 30d supply, fill #0

## 2021-02-16 MED ORDER — CAPLYTA 42 MG PO CAPS: 42.0000 mg | ORAL_CAPSULE | Freq: Every day | ORAL | 3 refills | Status: AC

## 2021-02-16 MED ORDER — TRAZODONE HCL 50 MG PO TABS: 50.0000 mg | ORAL_TABLET | Freq: Every day | ORAL | 3 refills | Status: AC

## 2021-02-16 NOTE — Progress Notes (Signed)
BH MD/PA/NP OP Progress Note Virtual Visit via Video Note  I connected with Leah Shelton on 02/16/21 at  1:00 PM EDT by a video enabled telemedicine application and verified that I am speaking with the correct person using two identifiers.  Location: Patient: Home Provider: Clinic   I discussed the limitations of evaluation and management by telemedicine and the availability of in person appointments. The patient expressed understanding and agreed to proceed.  I provided 30 minutes of non-face-to-face time during this encounter.    02/16/2021 1:51 PM Leah Shelton  MRN:  [id number]  Chief Complaint: "I haven't been having a lot of fluctuations in my mood"  HPI: 35 year old female seen today for follow-up psychiatric evaluation.  She has a psychiatric history of PTSD, depression, bipolar, and Poly substance use (heroin, marijuana, Xanax sober for 2 years).  She is currently managed on Caplyta 42 mg daily, gabapentin 300 mg 3 times daily, and trazodone 25 to 50 mg nightly as needed.  She notes her medications are somewhat effective in managing her psychiatric conditions.  Today she is well-groomed, pleasant, cooperative, maintained eye contact, and engaged in conversation.  She informed provider that Caplyta has been somewhat effective in managing her mood however notes that she has been having fluctuations recently.  She endorses symptoms of hypomania such as racing thoughts, distractibility, irritability, and insomnia (noting that she sleeps 5 hours nightly).  Today she denies SI/HI/VAH or paranoia.    Patient notes that she continues to be anxious and depressed most days.  Today provider conducted a GAD-7 and patient scored a 15, at her last visit she scored a 17.  She notes that she worries about her health and her children.  She informed Clinical research associate that she continues to fight for custody of her twins.  She notes that she and her ex went to mediation however is waiting on a court  date.  Provider also conducted a PHQ-9 and patient scored an 11, at her last visit she scored a 9.  She endorses having an adequate appetite.  That she is agreeable to starting Lamictal 25 mg for 2 weeks, then increasing to 50 mg for 2 weeks, and then increasing to 75 mg to help manage mood.  Patient notes that she takes gabapentin periodically.  Provider informed patient that she should take gabapentin as prescribed to help manage her anxiety.  She endorsed understanding and agreed.  She will follow-up with outpatient counseling for therapy.  No other concerns noted at this time.  Visit Diagnosis:    ICD-10-CM   1. GAD (generalized anxiety disorder)  F41.1 gabapentin (NEURONTIN) 300 MG capsule    traZODone (DESYREL) 50 MG tablet    2. Bipolar 2 disorder, major depressive episode (HCC)  F31.81 lumateperone tosylate (CAPLYTA) 42 MG capsule    traZODone (DESYREL) 50 MG tablet    lamoTRIgine (LAMICTAL) 25 MG tablet      Past Psychiatric History: Depression, Bipolar disorder, PTSD, borderline personality, and Poly substance use (heroin, marijuana, Xanax)  Past Medical History:  Past Medical History:  Diagnosis Date   Bipolar 1 disorder (HCC)     Past Surgical History:  Procedure Laterality Date   CHOLECYSTECTOMY     TONSILLECTOMY     TUBAL LIGATION      Family Psychiatric History: Mother depression and son ADHD  Family History:  Family History  Problem Relation Age of Onset   Healthy Mother    Healthy Father     Social History:  Social  History   Socioeconomic History   Marital status: Divorced    Spouse name: Not on file   Number of children: Not on file   Years of education: Not on file   Highest education level: Not on file  Occupational History   Not on file  Tobacco Use   Smoking status: Every Day    Packs/day: 0.50    Types: Cigarettes   Smokeless tobacco: Never  Substance and Sexual Activity   Alcohol use: Never   Drug use: Never   Sexual activity: Not on  file  Other Topics Concern   Not on file  Social History Narrative   Not on file   Social Determinants of Health   Financial Resource Strain: Not on file  Food Insecurity: Not on file  Transportation Needs: Not on file  Physical Activity: Not on file  Stress: Not on file  Social Connections: Not on file    Allergies:  Allergies  Allergen Reactions   Keflex [Cephalexin] Hives    Metabolic Disorder Labs: No results found for: HGBA1C, MPG No results found for: PROLACTIN No results found for: CHOL, TRIG, HDL, CHOLHDL, VLDL, LDLCALC No results found for: TSH  Therapeutic Level Labs: No results found for: LITHIUM No results found for: VALPROATE No components found for:  CBMZ  Current Medications: Current Outpatient Medications  Medication Sig Dispense Refill   lamoTRIgine (LAMICTAL) 25 MG tablet Take 1 tablet (25 mg total) by mouth daily. 90 tablet 2   albuterol (VENTOLIN HFA) 108 (90 Base) MCG/ACT inhaler Inhale 2 puffs into the lungs every 4 (four) hours as needed for wheezing or shortness of breath. 18 g 0   benzonatate (TESSALON) 100 MG capsule Take 1 capsule (100 mg total) by mouth every 8 (eight) hours. 21 capsule 0   gabapentin (NEURONTIN) 300 MG capsule Take 1 capsule (300 mg total) by mouth 3 (three) times daily. 90 capsule 3   lumateperone tosylate (CAPLYTA) 42 MG capsule Take 1 capsule (42 mg total) by mouth daily. 30 capsule 3   traZODone (DESYREL) 50 MG tablet Take 1 tablet (50 mg total) by mouth at bedtime. 30 tablet 3   No current facility-administered medications for this visit.     Musculoskeletal: Strength & Muscle Tone:  Unable to assess due to telehealth visit Gait & Station:  Unable to assess due to telehealth visit Patient leans: N/A  Psychiatric Specialty Exam: Review of Systems  There were no vitals taken for this visit.There is no height or weight on file to calculate BMI.  General Appearance: Well Groomed  Eye Contact:  Good  Speech:  Clear  and Coherent and Normal Rate  Volume:  Normal  Mood:  Anxious and Depressed  Affect:  Appropriate and Congruent  Thought Process:  Coherent, Goal Directed and Linear  Orientation:  Full (Time, Place, and Person)  Thought Content: WDL and Logical   Suicidal Thoughts:  No  Homicidal Thoughts:  No  Memory:  Immediate;   Good Recent;   Good Remote;   Good  Judgement:  Good  Insight:  Good  Psychomotor Activity:  Normal  Concentration:  Concentration: Good and Attention Span: Good  Recall:  Good  Fund of Knowledge: Good  Language: Good  Akathisia:  No  Handed:  Right  AIMS (if indicated): Not done  Assets:  Communication Skills Desire for Improvement Financial Resources/Insurance Housing Physical Health Social Support  ADL's:  Intact  Cognition: WNL  Sleep:  Fair   Screenings: GAD-7  Flowsheet Row Video Visit from 02/16/2021 in [facility] Video Visit from 11/17/2020 in [facility] Counselor from 04/05/2020 in [facility]  Total GAD-7 Score 15 17 19       PHQ2-9    Flowsheet Row Video Visit from 02/16/2021 in [facility] Video Visit from 11/17/2020 in [facility] Counselor from 04/05/2020 in [facility]  PHQ-2 Total Score 2 2 1   PHQ-9 Total Score 11 9 9       Flowsheet Row ED from 12/15/2020 in [facility] Video Visit from 11/17/2020 in [facility] ED from 11/01/2020 in [facility]  C-SSRS RISK CATEGORY No Risk No Risk No Risk        Assessment and Plan: Patient endorses symptoms of anxiety, depression, insomnia, and hypomania.  That she is agreeable to starting Lamictal 25 mg for 2 weeks, then increasing to 50 mg for 2 weeks, and then increasing to 75 mg to help manage mood.  Patient notes that she takes  gabapentin periodically.  Provider informed patient that she should take gabapentin as prescribed to help manage her anxiety.  She endorsed understanding and agreed.  1. GAD (generalized anxiety disorder)  Continue- traZODone (DESYREL) 50 MG tablet; Take 1 tablet (50 mg total) by mouth at bedtime.  Dispense: 30 tablet; Refill: 2 Continue- gabapentin (NEURONTIN) 300 MG capsule; Take 1 capsule (300 mg total) by mouth 3 (three) times daily.  Dispense: 90 capsule; Refill: 2  2. Bipolar 2 disorder, major depressive episode (HCC)  Continue- Lumateperone Tosylate (CAPLYTA) 42 MG CAPS; Take 42 mg by mouth daily.  Dispense: 30 capsule; Refill: 2 Continue- traZODone (DESYREL) 50 MG tablet; Take 1 tablet (50 mg total) by mouth at bedtime.  Dispense: 30 tablet; Refill: 2. Continue- lamoTRIgine (LAMICTAL) 25 MG tablet; Take 1 tablet (25 mg total) by mouth daily.  Dispense: 90 tablet; Refill: 2    Follow up in 1.5 months Follow up with therapy.    11/19/2020, NP 02/16/2021, 1:51 PM

## 2021-02-17 ENCOUNTER — Other Ambulatory Visit: Payer: Self-pay

## 2021-02-22 ENCOUNTER — Other Ambulatory Visit (HOSPITAL_COMMUNITY): Payer: Self-pay | Admitting: Psychiatry

## 2021-02-22 ENCOUNTER — Other Ambulatory Visit: Payer: Self-pay

## 2021-02-22 DIAGNOSIS — F3181 Bipolar II disorder: Secondary | ICD-10-CM

## 2021-02-22 MED ORDER — CAPLYTA 42 MG PO CAPS
42.0000 mg | ORAL_CAPSULE | Freq: Every day | ORAL | 2 refills | Status: AC
  Filled 2021-02-22: qty 30, 30d supply, fill #0

## 2021-02-23 ENCOUNTER — Other Ambulatory Visit: Payer: Self-pay

## 2021-03-01 ENCOUNTER — Other Ambulatory Visit: Payer: Self-pay

## 2021-04-04 ENCOUNTER — Telehealth (HOSPITAL_COMMUNITY): Payer: No Payment, Other | Admitting: Psychiatry

## 2021-04-17 ENCOUNTER — Emergency Department (HOSPITAL_COMMUNITY)
Admission: EM | Admit: 2021-04-17 | Discharge: 2021-04-18 | Disposition: A | Discharge status: Home / Self Care | Payer: Self-pay | Attending: Emergency Medicine | Admitting: Emergency Medicine

## 2021-04-17 ENCOUNTER — Other Ambulatory Visit: Payer: Self-pay

## 2021-04-17 DIAGNOSIS — Z28310 Unvaccinated for covid-19: Secondary | ICD-10-CM | POA: Insufficient documentation

## 2021-04-17 DIAGNOSIS — Z20822 Contact with and (suspected) exposure to covid-19: Secondary | ICD-10-CM | POA: Insufficient documentation

## 2021-04-17 DIAGNOSIS — B349 Viral infection, unspecified: Secondary | ICD-10-CM | POA: Insufficient documentation

## 2021-04-17 DIAGNOSIS — F17210 Nicotine dependence, cigarettes, uncomplicated: Secondary | ICD-10-CM | POA: Insufficient documentation

## 2021-04-17 MED ORDER — ONDANSETRON 4 MG PO TBDP
4.0000 mg | ORAL_TABLET | Freq: Once | ORAL | Status: AC
  Administered 2021-04-18: 4 mg via ORAL
  Filled 2021-04-17: qty 1

## 2021-04-17 NOTE — ED Provider Notes (Signed)
[facility]-EMERGENCY DEPT Provider Note   CSN: [id number] Arrival date & time: 04/17/21  2323     History Chief Complaint  Patient presents with   Nausea   Fever   Headache    Leah Shelton is a 35 y.o. female.  35 year old female with a history of bipolar 1 disorder presents to the emergency department for fever.  She reports a fever of 102 F at home yesterday.  States that fever persisted into this morning, but fever this morning was subjective.  Notes associated nausea without vomiting.  No cough, congestion, shortness of breath.  Does state that she has had some pelvic cramping as well as urinary frequency.  No dysuria.  She took ibuprofen for symptoms with some improvement.  States that she has been around other individuals who have been sick with a presumptive viral illness.  She is not vaccinated against COVID-19.  Patient also c/o a frontal headache which has been chronic and intermittent without inciting factors.  She denies any headache at present.  The history is provided by the patient. No language interpreter was used.  Fever Associated symptoms: headaches   Headache Associated symptoms: fever       Past Medical History:  Diagnosis Date   Bipolar 1 disorder [facility])     Patient Active Problem List   Diagnosis Date Noted   Bipolar 2 disorder, major depressive episode (HCC) 11/17/2020   PTSD (post-traumatic stress disorder) 11/17/2020   Bipolar 1 disorder, mixed, moderate (HCC) 02/24/2020   MDD (major depressive disorder) 02/24/2020   GAD (generalized anxiety disorder) 02/24/2020    Past Surgical History:  Procedure Laterality Date   CHOLECYSTECTOMY     TONSILLECTOMY     TUBAL LIGATION       OB History   No obstetric history on file.     Family History  Problem Relation Age of Onset   Healthy Mother    Healthy Father     Social History   Tobacco Use   Smoking status: Every Day    Packs/day: 0.50    Types: Cigarettes    Smokeless tobacco: Never  Substance Use Topics   Alcohol use: Never   Drug use: Never    Home Medications Prior to Admission medications   Medication Sig Start Date End Date Taking? Authorizing Provider  albuterol (VENTOLIN HFA) 108 (90 Base) MCG/ACT inhaler Inhale 2 puffs into the lungs every 4 (four) hours as needed for wheezing or shortness of breath. 12/15/20  Yes Garlon Hatchet, PA-C  gabapentin (NEURONTIN) 300 MG capsule Take 1 capsule (300 mg total) by mouth 3 (three) times daily. 02/16/21  Yes Toy Cookey E, NP  ibuprofen (ADVIL) 200 MG tablet Take 400 mg by mouth every 6 (six) hours as needed for fever, mild pain, moderate pain or cramping.   Yes [provider]  lumateperone tosylate (CAPLYTA) 42 MG capsule Take 1 capsule (42 mg total) by mouth daily. 02/16/21  Yes Toy Cookey E, NP  lumateperone tosylate (CAPLYTA) 42 MG capsule Take 42 mg by mouth daily. 02/22/21  Yes Toy Cookey E, NP  benzonatate (TESSALON) 100 MG capsule Take 1 capsule (100 mg total) by mouth every 8 (eight) hours. Patient not taking: Reported on 04/18/2021 12/15/20   Garlon Hatchet, PA-C  lamoTRIgine (LAMICTAL) 25 MG tablet Take 1 tablet (25 mg total) by mouth daily. Patient not taking: No sig reported 02/16/21   Shanna Cisco, NP  traZODone (DESYREL) 50 MG tablet Take 1  tablet (50 mg total) by mouth at bedtime. Patient not taking: No sig reported 02/16/21   Shanna Cisco, NP    Allergies    Keflex [cephalexin]  Review of Systems   Review of Systems  Constitutional:  Positive for fever.  Neurological:  Positive for headaches.  Ten systems reviewed and are negative for acute change, except as noted in the HPI.    Physical Exam Updated Vital Signs BP 107/76 (BP Location: Right Arm)   Pulse 86   Temp 98 F (36.7 C)   Resp 16   Ht 5\' 2"  (1.575 m)   Wt 68 kg   SpO2 100%   BMI 27.44 kg/m   Physical Exam Vitals and nursing note reviewed.  Constitutional:       General: She is not in acute distress.    Appearance: She is well-developed. She is not diaphoretic.     Comments: Appears timid. Nontoxic.  HENT:     Head: Normocephalic and atraumatic.     Right Ear: External ear normal.     Left Ear: External ear normal.  Eyes:     General: No scleral icterus.    Conjunctiva/sclera: Conjunctivae normal.  Cardiovascular:     Rate and Rhythm: Normal rate and regular rhythm.     Pulses: Normal pulses.  Pulmonary:     Effort: Pulmonary effort is normal. No respiratory distress.     Breath sounds: No stridor. No wheezing.     Comments: Lungs CTAB. Respirations even and unlabored. Abdominal:     Palpations: Abdomen is soft.  Musculoskeletal:        General: Normal range of motion.     Cervical back: Normal range of motion.  Skin:    General: Skin is warm and dry.     Coloration: Skin is not pale.     Findings: No erythema or rash.  Neurological:     Mental Status: She is alert and oriented to person, place, and time.     Coordination: Coordination normal.  Psychiatric:        Behavior: Behavior normal.    ED Results / Procedures / Treatments   Labs (all labs ordered are listed, but only abnormal results are displayed) Labs Reviewed  URINALYSIS, ROUTINE W REFLEX MICROSCOPIC - Abnormal; Notable for the following components:      Result Value   Color, Urine YELLOW (*)    APPearance CLOUDY (*)    Hgb urine dipstick TRACE (*)    Ketones, ur TRACE (*)    Protein, ur TRACE (*)    Bacteria, UA RARE (*)    All other components within normal limits  RESP PANEL BY RT-PCR (FLU A&B, COVID) ARPGX2  WET PREP, GENITAL  PREGNANCY, URINE  GC/CHLAMYDIA PROBE AMP ([place]) NOT AT [facility]    EKG None  Radiology No results found.  Procedures Procedures   Medications Ordered in ED Medications  ondansetron (ZOFRAN-ODT) disintegrating tablet 4 mg (4 mg Oral Given 04/18/21 0017)    ED Course  I have reviewed the triage vital signs and the  nursing notes.  Pertinent labs & imaging results that were available during my care of the patient were reviewed by me and considered in my medical decision making (see chart for details).  Clinical Course as of 04/18/21 0258  Mon Apr 18, 2021  0130 Urinalysis findings consistent with contamination [KH]    Clinical Course User Index [KH] Apr 20, 2021, PA-C   MDM Rules/Calculators/A&P  Patient presenting with reports of fever onset yesterday.  She has been afebrile in the emergency department with stable vital signs.  At this time there does not appear to be any evidence of an acute emergency medical condition and the patient appears stable for discharge with appropriate outpatient follow up.  Suspect viral etiology.  Diagnosis was discussed with patient who verbalizes understanding and is agreeable to discharge.    Final Clinical Impression(s) / ED Diagnoses Final diagnoses:  Viral illness    Rx / DC Orders ED Discharge Orders     None        Antony Madura, PA-C 04/18/21 0258    Geoffery Lyons, MD 04/18/21 [phone]

## 2021-04-17 NOTE — ED Triage Notes (Signed)
Pt complains of nausea, headache, and fever since yesterday.

## 2021-04-18 LAB — WET PREP, GENITAL
Clue Cells Wet Prep HPF POC: NONE SEEN
Sperm: NONE SEEN
Trich, Wet Prep: NONE SEEN
WBC, Wet Prep HPF POC: NONE SEEN
Yeast Wet Prep HPF POC: NONE SEEN

## 2021-04-18 LAB — URINALYSIS, ROUTINE W REFLEX MICROSCOPIC
Bilirubin Urine: NEGATIVE
Glucose, UA: NEGATIVE mg/dL
Leukocytes,Ua: NEGATIVE
Nitrite: NEGATIVE
Specific Gravity, Urine: 1.015 (ref 1.005–1.030)
pH: 6.5 (ref 5.0–8.0)

## 2021-04-18 LAB — RESP PANEL BY RT-PCR (FLU A&B, COVID) ARPGX2
Influenza A by PCR: NEGATIVE
Influenza B by PCR: NEGATIVE
SARS Coronavirus 2 by RT PCR: NEGATIVE

## 2021-04-18 LAB — PREGNANCY, URINE: Preg Test, Ur: NEGATIVE

## 2021-04-18 LAB — GC/CHLAMYDIA PROBE AMP (~~LOC~~) NOT AT ARMC
Chlamydia: NEGATIVE
Comment: NEGATIVE
Comment: NORMAL
Neisseria Gonorrhea: NEGATIVE

## 2021-04-18 NOTE — Discharge Instructions (Signed)
We recommend Tylenol or Motrin for management of fever, headache, body aches.  Get plenty of rest and drink plenty of fluids to prevent dehydration.  Follow-up with a primary doctor.

## 2021-05-30 ENCOUNTER — Other Ambulatory Visit: Payer: Self-pay

## 2021-06-21 ENCOUNTER — Other Ambulatory Visit: Payer: Self-pay

## 2021-06-22 ENCOUNTER — Other Ambulatory Visit: Payer: Self-pay

## 2021-07-20 ENCOUNTER — Other Ambulatory Visit: Payer: Self-pay

## 2021-07-25 ENCOUNTER — Other Ambulatory Visit: Payer: Self-pay

## 2021-09-08 IMAGING — DX DG CHEST 1V PORT
1 series · 1 of 1 positions shown · non-contrast
Comparison: No prior.

CLINICAL DATA: Cough.  Wheezing.  Shortness of breath.  Fever.

EXAM:
PORTABLE CHEST 1 VIEW

[chest ap]
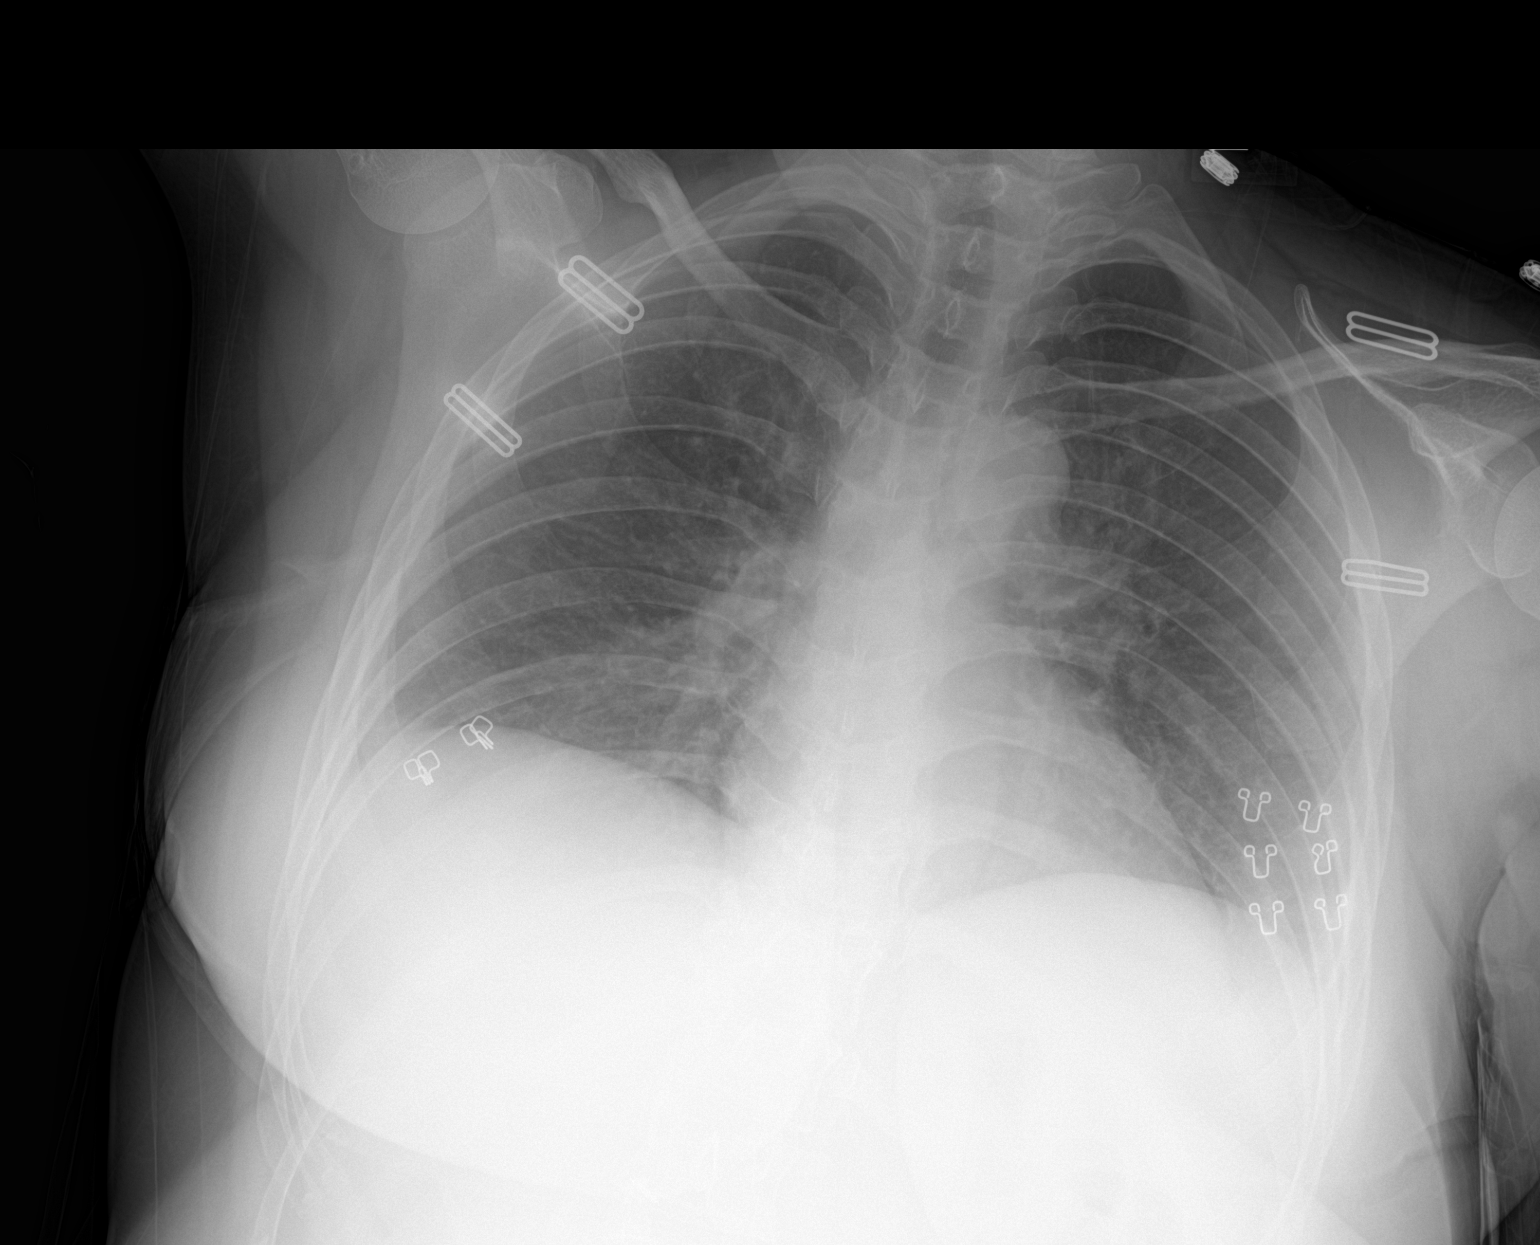

[1 of 1 positions shown; findings below may reference images not displayed]

FINDINGS: Mediastinum hilar structures normal. Heart size normal. Low lung
volumes. Mild bilateral interstitial prominence. Mild pneumonitis
cannot be excluded. No prominent pleural effusion. No pneumothorax.
Thoracic spine scoliosis. No acute bony abnormality.
IMPRESSION: Low lung volumes. Mild bilateral interstitial prominence. Mild
pneumonitis cannot be excluded.
